# Patient Record
Sex: Female | Born: 1975 | Race: White | Hispanic: No | Smoking: Former smoker
Health system: Southern US, Community
[De-identification: ages and names within clinical notes are randomized; demographics above are authoritative.]

## PROBLEM LIST (undated history)

## (undated) DIAGNOSIS — D649 Anemia, unspecified: Secondary | ICD-10-CM

## (undated) DIAGNOSIS — E079 Disorder of thyroid, unspecified: Secondary | ICD-10-CM

## (undated) HISTORY — PX: TUBAL LIGATION: SHX77

## (undated) HISTORY — PX: LEEP: SHX91

## (undated) HISTORY — DX: Disorder of thyroid, unspecified: E07.9

## (undated) HISTORY — DX: Anemia, unspecified: D64.9

---

## 2000-07-26 ENCOUNTER — Other Ambulatory Visit: Admission: RE | Admit: 2000-07-26 | Discharge: 2000-07-26 | Payer: Self-pay | Admitting: Gynecology

## 2000-07-26 ENCOUNTER — Encounter (INDEPENDENT_AMBULATORY_CARE_PROVIDER_SITE_OTHER): Payer: Self-pay | Admitting: Specialist

## 2000-08-20 ENCOUNTER — Other Ambulatory Visit: Admission: RE | Admit: 2000-08-20 | Discharge: 2000-08-20 | Payer: Self-pay | Admitting: Gynecology

## 2000-08-20 ENCOUNTER — Encounter (INDEPENDENT_AMBULATORY_CARE_PROVIDER_SITE_OTHER): Payer: Self-pay

## 2001-02-01 ENCOUNTER — Other Ambulatory Visit: Admission: RE | Admit: 2001-02-01 | Discharge: 2001-02-01 | Payer: Self-pay | Admitting: Gynecology

## 2002-05-23 ENCOUNTER — Other Ambulatory Visit: Admission: RE | Admit: 2002-05-23 | Discharge: 2002-05-23 | Payer: Self-pay | Admitting: Gynecology

## 2003-06-19 ENCOUNTER — Other Ambulatory Visit: Admission: RE | Admit: 2003-06-19 | Discharge: 2003-06-19 | Payer: Self-pay | Admitting: Gynecology

## 2004-05-27 ENCOUNTER — Other Ambulatory Visit: Admission: RE | Admit: 2004-05-27 | Discharge: 2004-05-27 | Payer: Self-pay | Admitting: Gynecology

## 2005-05-29 ENCOUNTER — Other Ambulatory Visit: Admission: RE | Admit: 2005-05-29 | Discharge: 2005-05-29 | Payer: Self-pay | Admitting: Gynecology

## 2006-06-02 ENCOUNTER — Other Ambulatory Visit: Admission: RE | Admit: 2006-06-02 | Discharge: 2006-06-02 | Payer: Self-pay | Admitting: Gynecology

## 2006-10-26 ENCOUNTER — Ambulatory Visit (HOSPITAL_COMMUNITY): Admission: RE | Admit: 2006-10-26 | Discharge: 2006-10-26 | Payer: Self-pay | Admitting: Obstetrics and Gynecology

## 2007-02-07 ENCOUNTER — Inpatient Hospital Stay (HOSPITAL_COMMUNITY): Admission: AD | Admit: 2007-02-07 | Discharge: 2007-02-09 | Payer: Self-pay | Admitting: Obstetrics and Gynecology

## 2007-03-17 IMAGING — US US ABDOMEN COMPLETE
1 series · 14 of 25 positions shown · non-contrast
Comparison: none

CLINICAL DATA: 25 week pregnant patient with right upper quadrant pain.  
 ABDOMEN ULTRASOUND:
TECHNIQUE: Complete abdominal ultrasound examination was performed including evaluation of the liver, gallbladder, bile ducts, pancreas, kidneys, spleen, IVC, and abdominal aorta.

[Series 1: us abdomen complete · 0.35mm/px · 14 of 70 slices shown]
[im 1/70]
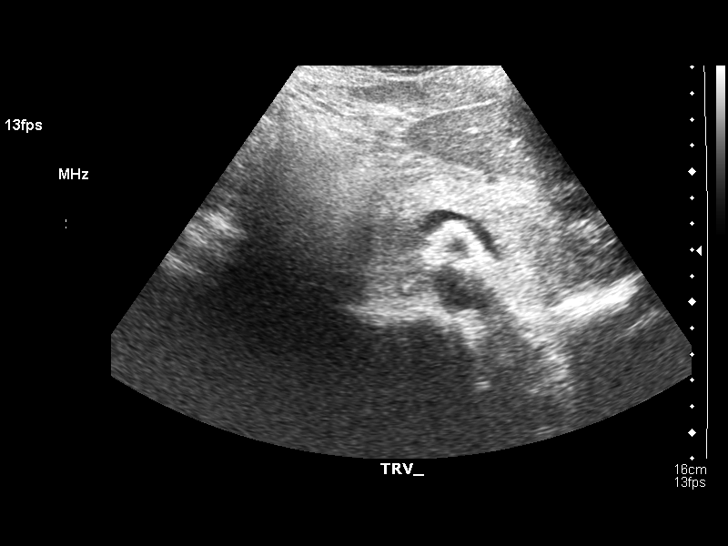
[im 6/70]
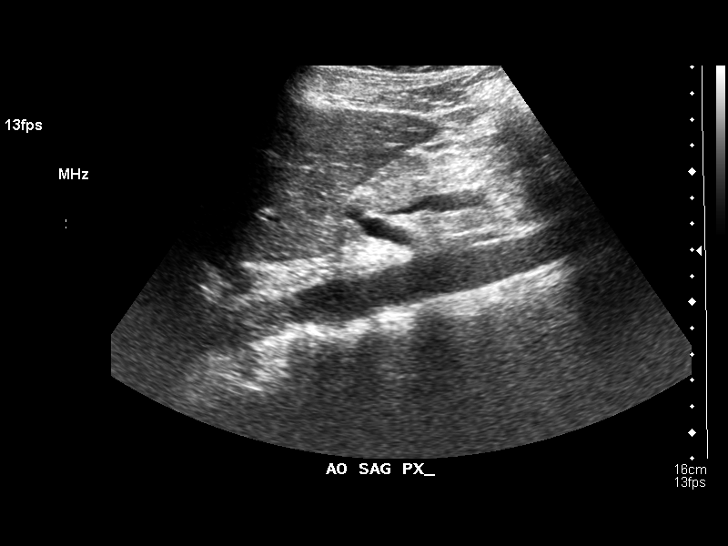
[im 12/70]
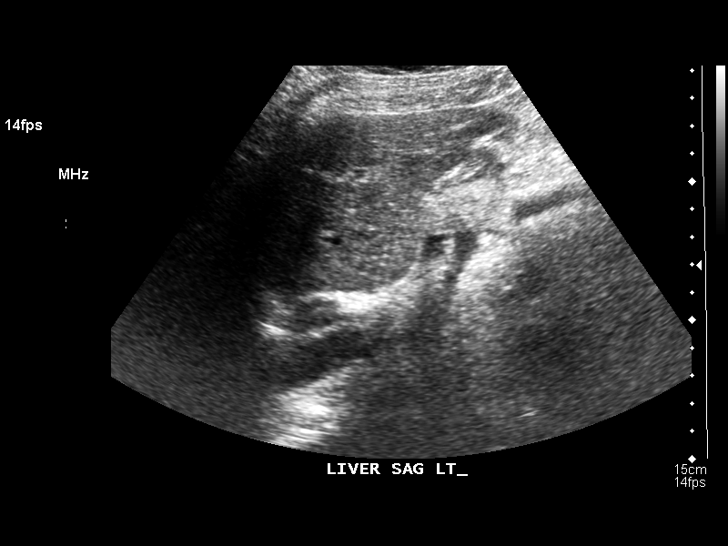
[im 18/70]
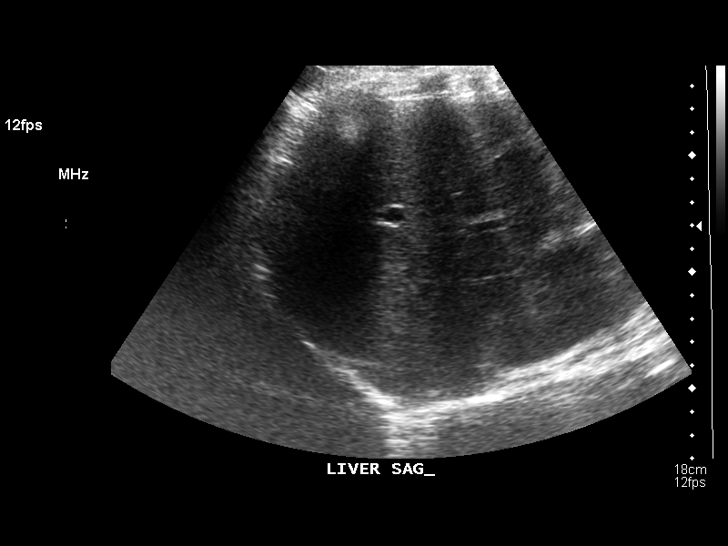
[im 24/70]
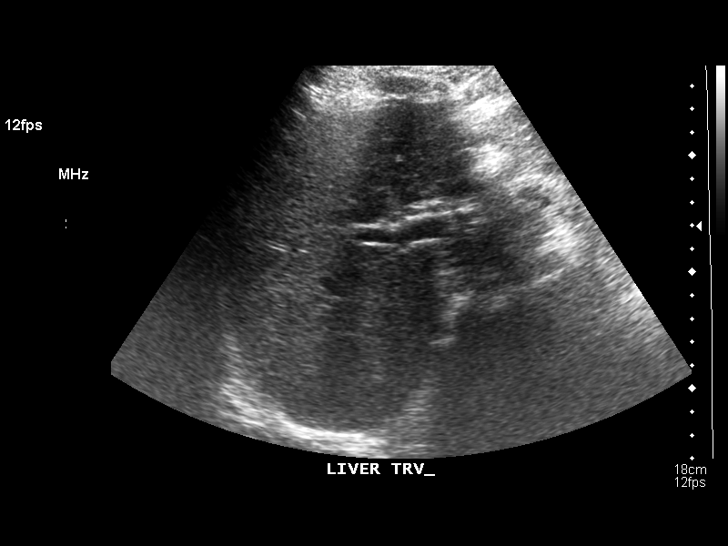
[im 26/70]
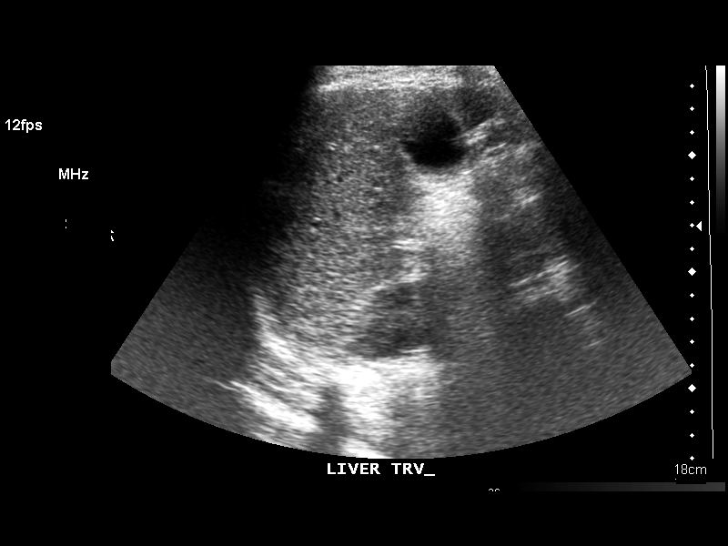
[im 32/70]
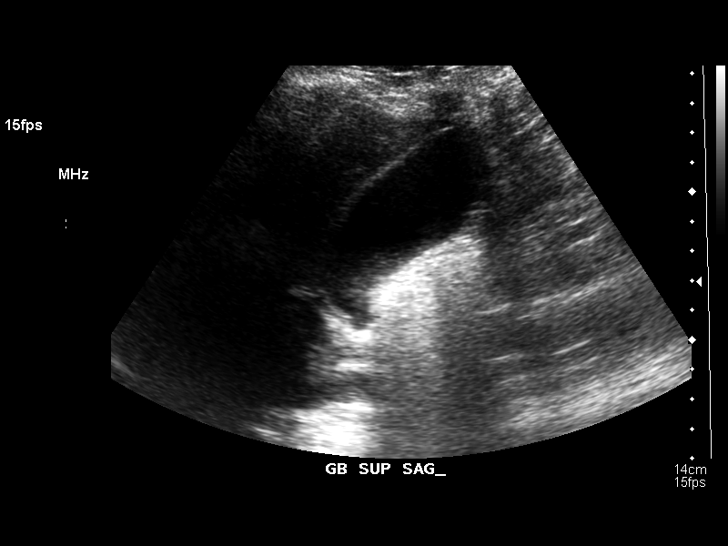
[im 38/70]
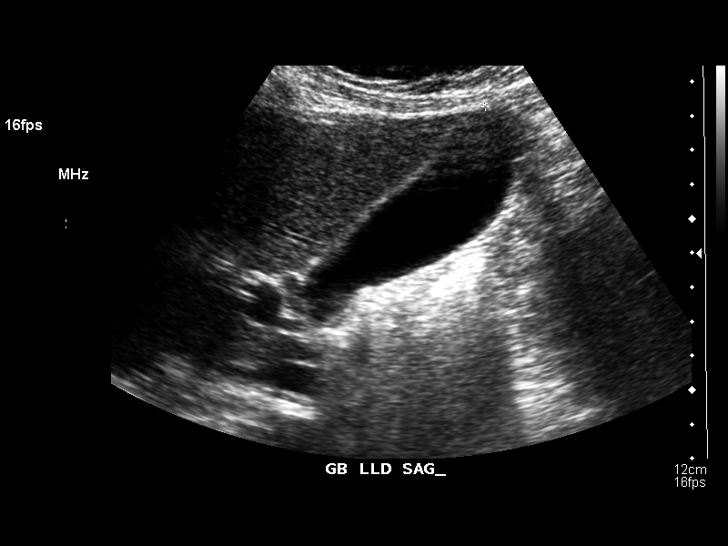
[im 44/70]
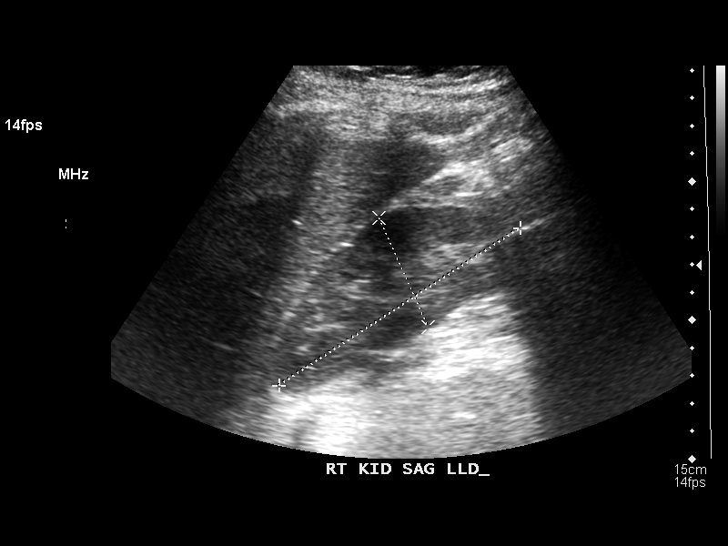
[im 47/70]
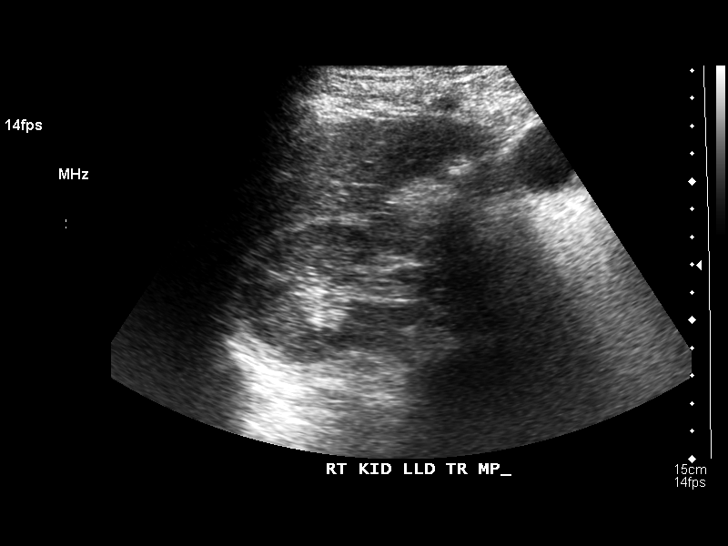
[im 52/70]
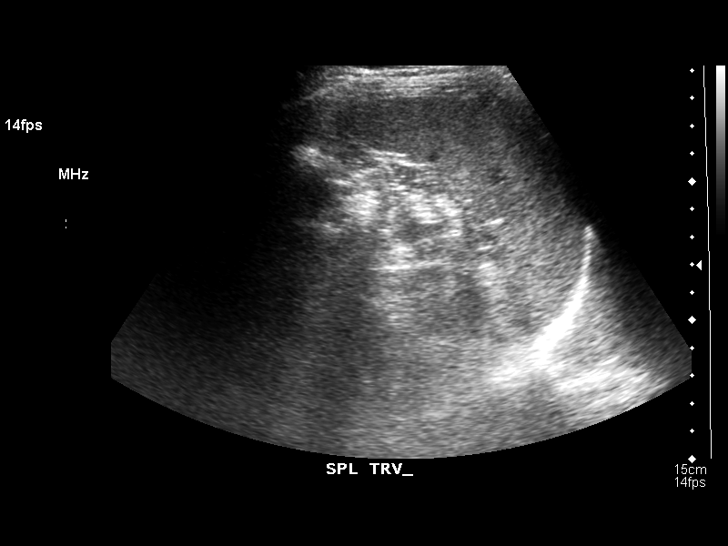
[im 58/70]
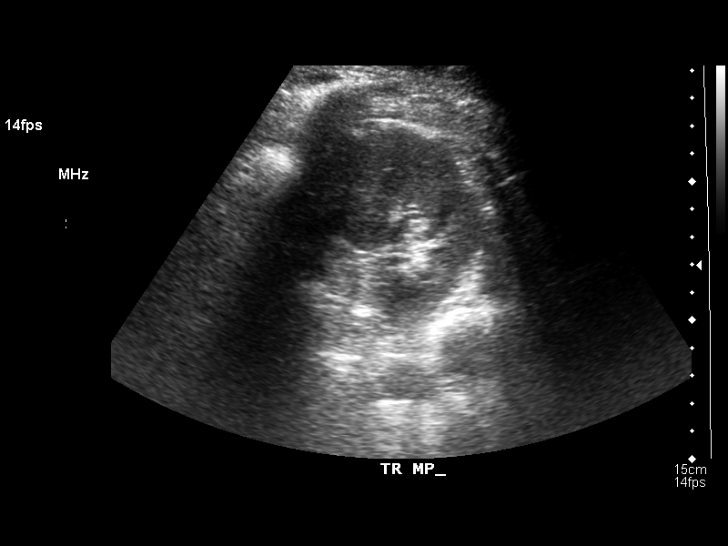
[im 64/70]
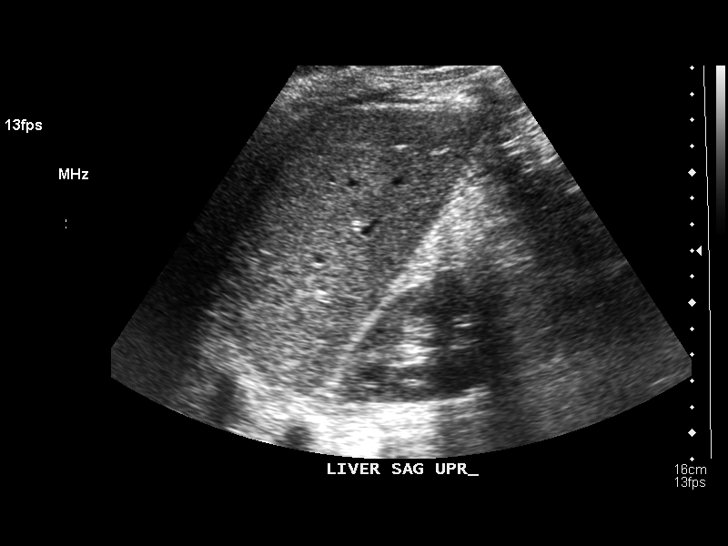
[im 70/70]
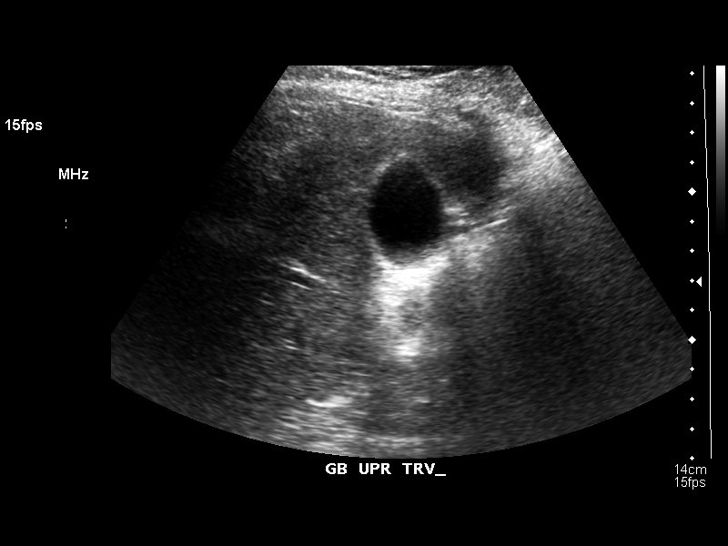

[14 of 25 positions shown; findings below may reference images not displayed]

FINDINGS: The gallbladder has a normal appearance with no evidence of stones, wall thickening, or pericholecystic fluid.  The common duct is normal measuring 2-3 mm in width, and there is no intrahepatic biliary ductal dilatation.  The liver and spleen have a normal size, shape, and echotexture.  The visualized portions of the pancreas, IVC, and abdominal aorta have a normal appearance.  There is no ascites.  Both kidneys have a normal size, shape, and echotexture.   The right kidney measures 10.4 cm and the left kidney measures 11.3 cm in length.  No hydronephrosis or shadowing calculi are seen.
IMPRESSION: Normal abdominal ultrasound.

## 2009-01-29 ENCOUNTER — Inpatient Hospital Stay (HOSPITAL_COMMUNITY): Admission: AD | Admit: 2009-01-29 | Discharge: 2009-01-29 | Payer: Self-pay | Admitting: Obstetrics and Gynecology

## 2009-07-08 ENCOUNTER — Inpatient Hospital Stay (HOSPITAL_COMMUNITY): Admission: AD | Admit: 2009-07-08 | Discharge: 2009-07-10 | Payer: Self-pay | Admitting: Obstetrics and Gynecology

## 2009-07-08 ENCOUNTER — Encounter (INDEPENDENT_AMBULATORY_CARE_PROVIDER_SITE_OTHER): Payer: Self-pay | Admitting: Obstetrics and Gynecology

## 2009-08-28 ENCOUNTER — Ambulatory Visit (HOSPITAL_COMMUNITY): Admission: RE | Admit: 2009-08-28 | Discharge: 2009-08-28 | Payer: Self-pay | Admitting: Obstetrics and Gynecology

## 2011-03-27 LAB — CBC
Hemoglobin: 13 g/dL (ref 12.0–15.0)
MCHC: 34.6 g/dL (ref 30.0–36.0)
Platelets: 220 10*3/uL (ref 150–400)
RBC: 4.11 MIL/uL (ref 3.87–5.11)
RDW: 12.7 % (ref 11.5–15.5)
WBC: 7.3 10*3/uL (ref 4.0–10.5)

## 2011-03-29 LAB — CBC
HCT: 36.4 % (ref 36.0–46.0)
Hemoglobin: 12.6 g/dL (ref 12.0–15.0)
MCHC: 34.5 g/dL (ref 30.0–36.0)
Platelets: 214 10*3/uL (ref 150–400)
RDW: 14.2 % (ref 11.5–15.5)

## 2011-03-29 LAB — RPR: RPR Ser Ql: NONREACTIVE

## 2011-04-07 LAB — CBC
MCHC: 34.3 g/dL (ref 30.0–36.0)
Platelets: 207 10*3/uL (ref 150–400)
RBC: 3.92 MIL/uL (ref 3.87–5.11)
WBC: 18.6 10*3/uL — ABNORMAL HIGH (ref 4.0–10.5)

## 2011-04-07 LAB — URINALYSIS, ROUTINE W REFLEX MICROSCOPIC
Bilirubin Urine: NEGATIVE
Glucose, UA: NEGATIVE mg/dL
Hgb urine dipstick: NEGATIVE
Nitrite: NEGATIVE
Protein, ur: NEGATIVE mg/dL
Specific Gravity, Urine: 1.025 (ref 1.005–1.030)
pH: 6 (ref 5.0–8.0)

## 2011-04-07 LAB — COMPREHENSIVE METABOLIC PANEL
Albumin: 3.2 g/dL — ABNORMAL LOW (ref 3.5–5.2)
CO2: 24 mEq/L (ref 19–32)
Calcium: 8.8 mg/dL (ref 8.4–10.5)
Chloride: 101 mEq/L (ref 96–112)

## 2011-04-07 LAB — DIFFERENTIAL
Basophils Absolute: 0.1 10*3/uL (ref 0.0–0.1)
Eosinophils Absolute: 0.2 10*3/uL (ref 0.0–0.7)
Eosinophils Relative: 1 % (ref 0–5)
Lymphocytes Relative: 6 % — ABNORMAL LOW (ref 12–46)
Lymphs Abs: 1 10*3/uL (ref 0.7–4.0)
Neutrophils Relative %: 91 % — ABNORMAL HIGH (ref 43–77)

## 2011-05-05 NOTE — Discharge Summary (Signed)
Barbara Young, Barbara Young                 ACCOUNT NO.:  [id number]   MEDICAL RECORD NO.:  [id number]          PATIENT TYPE:  INP   LOCATION:  9119                          FACILITY:  [facility]   PHYSICIAN:  Malachi Pro. Ambrose Mantle, M.D. DATE OF BIRTH:  04/30/1976   DATE OF ADMISSION:  07/08/2009  DATE OF DISCHARGE:  07/10/2009                               DISCHARGE SUMMARY   This is a 35 year old white female, para 1-0-0-1, gravida 2, EDC July 05, 2009, admitted with rupture of membranes at 3:30 a.m. on the day of  admission.  Blood group and type O positive, negative antibody,  nonreactive serology, rubella immune, hepatitis B surface antigen  negative, HIV negative, GC and Chlamydia negative, one-hour Glucola 111,  group B strep negative, quad screen negative.  Ultrasound on January 11, 2009, 15 weeks 0 days, [facility] July 05, 2009, urinary tract infection  secondary to E. coli treated with Macrobid on January 22, 2009.  The  patient smoked and was advised to quit.  Prenatal care was otherwise  uncomplicated except the husband has a drug and alcohol abuse history,  but apparently is clean now after rehab.  At approximately 3:30 a.m. on  the day of admission, the patient awoke with spontaneous rupture of  membranes.  She came to the hospital at 5:30 a.m., rupture of membranes  was confirmed, and she was admitted.   PAST MEDICAL HISTORY:  No known allergies.  Operations:  In 2001 a LEEP  procedure, in 2009 breast cyst removed.  Illnesses:  None.  Alcohol,  tobacco, and drugs:  The patient does use tobacco one-half to 1 pack per  day.   FAMILY HISTORY:  No family history in first-degree relatives.   OBSTETRIC HISTORY:  In 2008, the patient delivered an 8-pound 6-ounce  female vaginally.  Her blood pressure dropped to 40 with the epidural.   PHYSICAL EXAMINATION:  On admission, normal vital signs, normal heart  and lungs.  The abdomen was soft.  Fundal height 39 cm.  On July 04, 2009, fetal heart tones  were normal.  Contractions every 3-4 minutes.  Cervix was 3 cm, slightly posterior, 50% vertex at minus 3.  Pitocin was  begun.  The patient entered active labor, felt pressure, and was noted  to be 10 cm dilated afternoon.  Vertex was at a plus 2 station without  pushing.  She delivered spontaneously OA over a second-degree midline  laceration and right periurethral laceration, a living female infant, 6  pounds 1 ounce, Apgars 9 at 1 and 9 at 5 minutes.  Dr. Ambrose Mantle was in  attendance.  There was no time for DeLee suctioning but bulb was used.  Placenta was intact.  Uterus was normal.  Second-degree midline  laceration repaired with 3-0 Vicryl.  Right periurethral laceration  hemostatic and not repaired.  Blood loss about 400 mL.  Postpartum, the  patient did well and was discharged on the second postpartum day.  Methods of sterilization were discussed.  The patient's mother had  expressed some concern about the use of the newer method.  I explained  the newer method as well as the time-honored method, explained the rate  of failure of both.  The patient's hemoglobin on admission was 12.6,  hematocrit 36.4, white count 12,200, platelet count 214,000.  Followup  hemoglobin 11.8.  RPR was nonreactive.   FINAL DIAGNOSIS:  Intrauterine pregnancy at 40+ weeks, delivered occiput  anterior.   OPERATION:  Spontaneous delivery OA, repair of second-degree midline  laceration.   FINAL CONDITION:  Improved.   Instructions include our regular discharge instruction booklet.  The  patient is advised to be aware of potential postpartum depression.  Percocet 30 tablets 5/325 one every 6 hours as needed for pain and  Motrin 600 mg 30  tablets 1 every 6 hours as needed for pain.  The patient is advised to  return to the office in 6 weeks for followup examination and to be sure  that her Medicaid is still in effect at the time her sterilization is  done if she desires to do that.      Malachi Pro.  Ambrose Mantle, M.D.  Electronically Signed     TFH/MEDQ  D:  07/10/2009  T:  07/10/2009  Job:  [id number]

## 2011-05-08 NOTE — Discharge Summary (Signed)
NAME:  Barbara Young, Barbara Young                 ACCOUNT NO.:  [id number]   MEDICAL RECORD NO.:  [id number]          PATIENT TYPE:  INP   LOCATION:  9146                          FACILITY:  [facility]   PHYSICIAN:  Sherron Monday, MD        DATE OF BIRTH:  07/03/1976   DATE OF ADMISSION:  02/07/2007  DATE OF DISCHARGE:  02/09/2007                               DISCHARGE SUMMARY   ADMISSION DIAGNOSES:  Intrauterine pregnancy at term, spontaneous  rupture of membranes.   DISCHARGE DIAGNOSES:  Intrauterine pregnancy at term, spontaneous  rupture of membranes, status post spontaneous vaginal delivery.   HISTORY OF PRESENT ILLNESS:  A 35 year old G1, P0 at 3 and 1 with SROM  at 3:30 a.m. for clear fluid, fetal movement, some spotting and  occasional contractions.   PAST MEDICAL HISTORY:  None significant.   PAST SURGICAL HISTORY:  Significant for a LEEP in 2001.   PAST OBSTETRIC/GYNECOLOGIC HISTORY:  G1 is the present pregnancy without  complications. She has a history of an abnormal Pap smear and status  post LEEP, and they have been normal since then. No history of sexually  transmitted disease.   MEDICATIONS:  Include prenatal vitamins.   ALLERGIES:  No known drug allergies.   SOCIAL HISTORY:  She admits to a half pack a day of tobacco use. No  alcohol or drugs. She is married.   PRENATAL LABORATORY DATA:  Hemoglobin 12.5, platelets 269,000, blood  type O positive, antibody screen negative, Pap smear within normal  limits. Gonorrhea negative, chlamydia negative, RPR nonreactive, rubella  immune, hepatitis B surface antigen negative, HIV nonreactive, normal  AFP. Cystic fibrosis screen within normal limits and Glucola of 132.  Ultrasound in the first trimester had a normal first trimester screen  confirmed for date and showed a normal nuchal thickness. Ultrasound on  September 17, 2006 at 20 weeks showed normal anatomy and an anterior  placenta.   When she was admitted, she had benign exam.  Fetal heart tones were in  the 130s to 140s and reactive, contracting every 3 to 4 minutes. Her  cervix was 1 to 2 cm dilated, 70% effaced and -3 station. We were unable  to obtain her group B strep record as they were not able to be found;  therefore, she was treated and prophylaxed with penicillin for group B  strep prophylaxis. Her intrapartum course was relatively uncomplicated.  She was started on Pitocin to augment her labor. Throughout her labor,  she had some variables and occasional late decelerations, but there were  generally relieved with position change, and they were closely  monitored. She progressed to complete, complete and +2 and pushed to  deliver a viable female infant at 88 with Apgars of 8 and 9 and a weight  of 8 pounds 6 pounds. Placenta was delivered intact at 1906. Directly  before she began pushing and with pushing, she had severe variables  which had quick return to baseline. Cord gas was obtained after delivery  and was found to be 7.32. She has bilateral periurethral laceration and  secondary perineal  laceration repaired with 3-0 Vicryl. Estimated blood  loss was approximately 500 mL. Discussed with patient and the father of  the baby circumcision of a female infant including risks, benefits and  alternatives. They wished to proceed. Postpartum course was relatively  uncomplicated. Her hemoglobin decreased from 12.3 to 10.3. She was  discharged to home on postpartum day 2, having remained afebrile  throughout her postpartum course, and with routine discharge  instructions as well as prescriptions for Motrin, Vicodin, and prenatal  vitamins.   DISCHARGE INFORMATION:  She plans to bottle feed. She is rubella immune.  O positive. Hemoglobin was 12.2 to 10.3. She plans to use birth control  pills which will be established at her 6-week checkup.      Sherron Monday, MD  Electronically Signed     JB/MEDQ  D:  02/09/2007  T:  02/09/2007  Job:  [id number]

## 2019-05-25 ENCOUNTER — Other Ambulatory Visit: Payer: Self-pay

## 2019-05-25 ENCOUNTER — Ambulatory Visit (HOSPITAL_COMMUNITY)
Admission: EM | Admit: 2019-05-25 | Discharge: 2019-05-25 | Disposition: A | Discharge status: Still In Hospital | Payer: Self-pay

## 2019-05-25 ENCOUNTER — Ambulatory Visit (HOSPITAL_COMMUNITY)
Admission: EM | Admit: 2019-05-25 | Discharge: 2019-05-25 | Disposition: A | Discharge status: Home / Self Care | Payer: Self-pay

## 2019-05-25 DIAGNOSIS — R21 Rash and other nonspecific skin eruption: Secondary | ICD-10-CM

## 2019-05-25 DIAGNOSIS — L255 Unspecified contact dermatitis due to plants, except food: Secondary | ICD-10-CM
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
## 2019-05-25 NOTE — ED Triage Notes (Signed)
Pt seen during downtime see downtime chart

## 2023-03-23 ENCOUNTER — Emergency Department (HOSPITAL_BASED_OUTPATIENT_CLINIC_OR_DEPARTMENT_OTHER)
Admission: EM | Admit: 2023-03-23 | Discharge: 2023-03-23 | Disposition: A | Discharge status: Home / Self Care | Payer: Medicaid Other | Attending: Emergency Medicine | Admitting: Emergency Medicine

## 2023-03-23 ENCOUNTER — Emergency Department (HOSPITAL_BASED_OUTPATIENT_CLINIC_OR_DEPARTMENT_OTHER): Payer: Medicaid Other

## 2023-03-23 ENCOUNTER — Other Ambulatory Visit: Payer: Self-pay

## 2023-03-23 ENCOUNTER — Encounter (HOSPITAL_BASED_OUTPATIENT_CLINIC_OR_DEPARTMENT_OTHER): Payer: Self-pay | Admitting: Emergency Medicine

## 2023-03-23 DIAGNOSIS — N920 Excessive and frequent menstruation with regular cycle: Secondary | ICD-10-CM | POA: Insufficient documentation

## 2023-03-23 DIAGNOSIS — N939 Abnormal uterine and vaginal bleeding, unspecified: Secondary | ICD-10-CM | POA: Diagnosis present

## 2023-03-23 LAB — CBC
HCT: 35.1 % — ABNORMAL LOW (ref 36.0–46.0)
Hemoglobin: 11.7 g/dL — ABNORMAL LOW (ref 12.0–15.0)
MCH: 29.5 pg (ref 26.0–34.0)
MCHC: 33.3 g/dL (ref 30.0–36.0)
MCV: 88.4 fL (ref 80.0–100.0)
Platelets: 276 10*3/uL (ref 150–400)
RBC: 3.97 MIL/uL (ref 3.87–5.11)
RDW: 14.3 % (ref 11.5–15.5)
WBC: 10.1 10*3/uL (ref 4.0–10.5)
nRBC: 0 % (ref 0.0–0.2)

## 2023-03-23 LAB — PREGNANCY, URINE: Preg Test, Ur: NEGATIVE

## 2023-03-23 NOTE — ED Provider Notes (Signed)
[facility] Provider Note   CSN: [id number] Arrival date & time: 03/23/23  1816     History  Chief Complaint  Patient presents with   Vaginal Bleeding    Barbara Young is a 47 y.o. female presenting today with vaginal bleeding.  She reports that 3 weeks ago her menstrual period came on time and she had very light bleeding for a week.  For the following 2 weeks she continued to have heavy bleeding which continues today.  Endorses very light cramping but she is passing large clots.  No nausea or vomiting.  No history of fibroids or cysts.  Does report an abnormal Pap smear in the past.  Sexually active without concerns for STDs.  No known coagulopathy.  Not on blood thinners.  Does not remember the last time she saw OB/GYN but she tried to make an appointment and they cannot see her until May.   Vaginal Bleeding      Home Medications Prior to Admission medications   Not on File      Allergies    Patient has no known allergies.    Review of Systems   Review of Systems  Genitourinary:  Positive for vaginal bleeding.    Physical Exam Updated Vital Signs BP (!) 171/75 (BP Location: Right Arm)   Pulse 86   Temp 98.4 F (36.9 C) (Oral)   Resp 18   Ht 5' 2.5" (1.588 m)   Wt 84.8 kg   LMP 03/02/2023 Comment: continues  SpO2 100%   BMI 33.66 kg/m  Physical Exam Vitals and nursing note reviewed.  Constitutional:      Appearance: Normal appearance.  HENT:     Head: Normocephalic and atraumatic.  Eyes:     General: No scleral icterus.    Conjunctiva/sclera: Conjunctivae normal.  Pulmonary:     Effort: Pulmonary effort is normal. No respiratory distress.  Genitourinary:    Comments: Pelvic exam performed in presence of RN.  No external lesions.  Vaginal vault with small amount of dark red blood in the vaginal vault.  No active bleeding from the cervix.  No tenderness on bimanual Skin:    Findings: No rash.  Neurological:      Mental Status: She is alert.  Psychiatric:        Mood and Affect: Mood normal.     ED Results / Procedures / Treatments   Labs (all labs ordered are listed, but only abnormal results are displayed) Labs Reviewed  CBC - Abnormal; Notable for the following components:      Result Value   Hemoglobin 11.7 (*)    HCT 35.1 (*)    All other components within normal limits  PREGNANCY, URINE    EKG None  Radiology US Pelvis Complete  Result Date: 03/23/2023 CLINICAL DATA:  Vaginal bleeding.  Negative pregnancy test. EXAM: TRANSABDOMINAL ULTRASOUND OF PELVIS DOPPLER ULTRASOUND OF OVARIES TECHNIQUE: Transabdominal ultrasound examination of the pelvis was performed including evaluation of the uterus, ovaries, adnexal regions, and pelvic cul-de-sac. Color and duplex Doppler ultrasound was utilized to evaluate blood flow to the ovaries. COMPARISON:  None Available. FINDINGS: Uterus Measurements: 12.1 x 6.1 x 6.4 cm = volume: 250 mL. No fibroids or other mass visualized. Endometrium Thickness: 19 mm.  No focal abnormality visualized. Right ovary Measurements: 5.3 x 2.4 x 3.4 cm = volume: 22 mL. Normal appearance/no adnexal mass. Left ovary Measurements: 3.5 x 1.9 x 3.4 cm = volume: 12 mL. Normal appearance/no  adnexal mass. Pulsed Doppler evaluation demonstrates normal low-resistance arterial and venous waveforms in both ovaries. Other: None IMPRESSION: Unremarkable pelvic ultrasound. Electronically Signed   By: Anner Crete M.D.   On: 03/23/2023 20:32   [place] Art/Ven Flow Abd Pelv Doppler  Result Date: 03/23/2023 CLINICAL DATA:  Vaginal bleeding.  Negative pregnancy test. EXAM: TRANSABDOMINAL ULTRASOUND OF PELVIS DOPPLER ULTRASOUND OF OVARIES TECHNIQUE: Transabdominal ultrasound examination of the pelvis was performed including evaluation of the uterus, ovaries, adnexal regions, and pelvic cul-de-sac. Color and duplex Doppler ultrasound was utilized to evaluate blood flow to the ovaries. COMPARISON:   None Available. FINDINGS: Uterus Measurements: 12.1 x 6.1 x 6.4 cm = volume: 250 mL. No fibroids or other mass visualized. Endometrium Thickness: 19 mm.  No focal abnormality visualized. Right ovary Measurements: 5.3 x 2.4 x 3.4 cm = volume: 22 mL. Normal appearance/no adnexal mass. Left ovary Measurements: 3.5 x 1.9 x 3.4 cm = volume: 12 mL. Normal appearance/no adnexal mass. Pulsed Doppler evaluation demonstrates normal low-resistance arterial and venous waveforms in both ovaries. Other: None IMPRESSION: Unremarkable pelvic ultrasound. Electronically Signed   By: Anner Crete M.D.   On: 03/23/2023 20:32    Procedures Procedures   Medications Ordered in ED Medications - No data to display  ED Course/ Medical Decision Making/ A&P                             Medical Decision Making Amount and/or Complexity of Data Reviewed Labs: ordered. Radiology: ordered.   47 year old female presenting today with menorrhagia.  Differential includes but is not limited to fibroids, cyst, perimenopause, malignancy, coagulopathy  This is not an exhaustive differential.    Past Medical History / Co-morbidities / Social History: History of abnormal Pap smear/LEEP procedure   Physical Exam: Pertinent physical exam findings include Mild amount of dark blood in the vaginal vault.  No clear active bleeding No abdominal tenderness  Lab Tests: I ordered, and personally interpreted labs.  The pertinent results include: Hemoglobin 11.7, negative pregnancy STD testing offered but declined   Imaging Studies: I ordered and independently visualized and interpreted pelvic ultrasound and I agree with the radiologist that no acute findings     MDM/Disposition: This is a 47 year old female presenting today with menorrhagia.  Has been going on for 2 weeks.  Unable to see OB/GYN until May.  She does report that today since she has been in the department the bleeding has slowed.  Pelvic ultrasound is  unremarkable.  Physical exam also did not show any tears, masses or abnormalities in the vaginal vault.  Normal cervix.  I suspect the patient may have some hormone imbalance/may be perimenopausal.  We discussed Megace however agree that this is likely not necessary at this time.  She will follow-up with OB/GYN as planned.  Hemoglobin stable, no active hemorrhaging on exam and patient was agreeable to discharge home.    Final Clinical Impression(s) / ED Diagnoses Final diagnoses:  Menorrhagia with regular cycle    Rx / DC Orders ED Discharge Orders     None      Results and diagnoses were explained to the patient. Return precautions discussed in full. Patient had no additional questions and expressed complete understanding.   This chart was dictated using voice recognition software.  Despite best efforts to proofread,  errors can occur which can change the documentation meaning.     Rhae Hammock, PA-C 03/23/23 2332    [place],  Alvin Critchley, DO 03/23/23 2334

## 2023-03-23 NOTE — Discharge Instructions (Addendum)
You came to the emergency department with heavy vaginal bleeding.  As we discussed, your ultrasound is normal.  Your blood counts are also stable.  I did not see anything alarming on your physical exam.  Please follow-up with OB/GYN as planned.  You may use Tylenol and ibuprofen for any discomfort.  With any dizziness, lightheadedness, palpitations, shortness of breath or worsening bleeding please return to the emergency department.  I recommend that you go to the [facility] emergency department or the women's unit is located so that they can see you if needed.  It was a pleasure to meet you and we hope you feel better!

## 2023-03-23 NOTE — ED Triage Notes (Signed)
Pt via pov from home with heavy vaginal bleeding x 3 weeks. She reports that her last normal period was very light and that she began heavily bleeding a few weeks later. Pt reports that she is soaking 7-8 pads per day. Pt reports golf ball sized clots in the blood. Pt alert & oriented, nad noted.

## 2023-03-29 ENCOUNTER — Encounter (HOSPITAL_BASED_OUTPATIENT_CLINIC_OR_DEPARTMENT_OTHER): Payer: Self-pay | Admitting: Emergency Medicine

## 2023-03-29 ENCOUNTER — Other Ambulatory Visit: Payer: Self-pay

## 2023-03-29 ENCOUNTER — Emergency Department (HOSPITAL_BASED_OUTPATIENT_CLINIC_OR_DEPARTMENT_OTHER)
Admission: EM | Admit: 2023-03-29 | Discharge: 2023-03-30 | Disposition: A | Discharge status: Home / Self Care | Payer: Medicaid Other | Attending: Emergency Medicine | Admitting: Emergency Medicine

## 2023-03-29 DIAGNOSIS — N939 Abnormal uterine and vaginal bleeding, unspecified: Secondary | ICD-10-CM | POA: Insufficient documentation

## 2023-03-29 LAB — CBC
HCT: 26 % — ABNORMAL LOW (ref 36.0–46.0)
Hemoglobin: 8.5 g/dL — ABNORMAL LOW (ref 12.0–15.0)
MCH: 29.3 pg (ref 26.0–34.0)
MCHC: 32.7 g/dL (ref 30.0–36.0)
MCV: 89.7 fL (ref 80.0–100.0)
Platelets: 248 10*3/uL (ref 150–400)
RBC: 2.9 MIL/uL — ABNORMAL LOW (ref 3.87–5.11)
RDW: 14.4 % (ref 11.5–15.5)
WBC: 11 10*3/uL — ABNORMAL HIGH (ref 4.0–10.5)
nRBC: 0 % (ref 0.0–0.2)

## 2023-03-29 LAB — PREGNANCY, URINE: Preg Test, Ur: NEGATIVE

## 2023-03-29 MED ORDER — MEGESTROL ACETATE 40 MG PO TABS: 40.0000 mg | ORAL_TABLET | Freq: Two times a day (BID) | ORAL | 0 refills | Status: DC

## 2023-03-29 MED ORDER — TRANEXAMIC ACID-NACL 1000-0.7 MG/100ML-% IV SOLN
1000.0000 mg | INTRAVENOUS | Status: AC
  Administered 2023-03-29: 1000 mg via INTRAVENOUS
  Filled 2023-03-29: qty 100

## 2023-03-29 MED ORDER — MEGESTROL ACETATE 40 MG PO TABS: 40.0000 mg | ORAL_TABLET | Freq: Every day | ORAL | Status: DC

## 2023-03-29 MED ORDER — FERROUS SULFATE 325 (65 FE) MG PO TABS: 325.0000 mg | ORAL_TABLET | Freq: Every day | ORAL | 1 refills | Status: AC

## 2023-03-29 MED ORDER — MEGESTROL ACETATE 40 MG PO TABS
40.0000 mg | ORAL_TABLET | Freq: Every day | ORAL | Status: DC
  Administered 2023-03-29: 40 mg via ORAL
  Filled 2023-03-29: qty 1

## 2023-03-29 MED ORDER — ESTROGENS CONJUGATED 25 MG IJ SOLR
25.0000 mg | Freq: Once | INTRAMUSCULAR | Status: AC
  Administered 2023-03-30: 25 mg via INTRAVENOUS
  Filled 2023-03-29: qty 25

## 2023-03-29 NOTE — Discharge Instructions (Signed)
Go to [facility] ED if bleeding persist

## 2023-03-29 NOTE — ED Triage Notes (Signed)
Vaginal bleeding, large clots. Seen for similar on 03/23/2023 Today felt dizzy and lightheaded, near syncopal on toilet. No LOC.

## 2023-03-29 NOTE — ED Provider Notes (Signed)
[place] EMERGENCY DEPARTMENT AT [facility] Provider Note   CSN: [id number] Arrival date & time: 03/29/23  2013     History  Chief Complaint  Patient presents with   Vaginal Bleeding    Barbara Young is a 47 y.o. female.  Patient complains of heavy vaginal bleeding.  Patient reports she started having bleeding 3 weeks ago.  Patient came in here on 4 2 and had an ultrasound and laboratory evaluation.  Patient reports bleeding has increased.  Patient reports that she passed a large clot today.  Patient complains of feeling lightheaded.  Patient denies any fever or chills she is not having any abdominal pain  The history is provided by the patient. No language interpreter was used.  Vaginal Bleeding Quality:  Clots Severity:  Severe Onset quality:  Sudden Duration:  3 weeks Timing:  Constant Progression:  Worsening Chronicity:  New Menstrual history:  Irregular Possible pregnancy: no   Relieved by:  Nothing Worsened by:  Nothing Ineffective treatments:  None tried Associated symptoms: no abdominal pain        Home Medications Prior to Admission medications   Medication Sig Start Date End Date Taking? Authorizing Provider  ferrous sulfate 325 (65 FE) MG tablet Take 1 tablet (325 mg total) by mouth daily with breakfast. 03/29/23  Yes Cheron Schaumann K, PA-C  megestrol (MEGACE) 40 MG tablet Take 1 tablet (40 mg total) by mouth 2 (two) times daily. 03/29/23  Yes Elson Areas, PA-C      Allergies    Patient has no known allergies.    Review of Systems   Review of Systems  Gastrointestinal:  Negative for abdominal pain.  Genitourinary:  Positive for vaginal bleeding.  All other systems reviewed and are negative.   Physical Exam Updated Vital Signs BP (!) 108/56   Pulse 85   Temp 98.4 F (36.9 C) (Oral)   Resp 18   LMP 03/02/2023 Comment: continues  SpO2 98%  Physical Exam Vitals reviewed.  Constitutional:      Appearance: Normal appearance.   Cardiovascular:     Rate and Rhythm: Normal rate.  Pulmonary:     Effort: Pulmonary effort is normal.  Abdominal:     General: Abdomen is flat.  Musculoskeletal:        General: Normal range of motion.  Skin:    General: Skin is warm.  Neurological:     General: No focal deficit present.     Mental Status: She is alert.  Psychiatric:        Mood and Affect: Mood normal.     ED Results / Procedures / Treatments   Labs (all labs ordered are listed, but only abnormal results are displayed) Labs Reviewed  CBC - Abnormal; Notable for the following components:      Result Value   WBC 11.0 (*)    RBC 2.90 (*)    Hemoglobin 8.5 (*)    HCT 26.0 (*)    All other components within normal limits  PREGNANCY, URINE    EKG None  Radiology No results found.  Procedures Procedures    Medications Ordered in ED Medications  conjugated estrogens (PREMARIN) injection 25 mg (has no administration in time range)  tranexamic acid (CYKLOKAPRON) IVPB 1,000 mg (has no administration in time range)  megestrol (MEGACE) tablet 40 mg (has no administration in time range)    ED Course/ Medical Decision Making/ A&P  Medical Decision Making Patient complains of heavy vaginal bleeding  Amount and/or Complexity of Data Reviewed Labs: ordered. Decision-making details documented in ED Course.    Details: Labs ordered reviewed and interpreted patient's hemoglobin is 8.5 today.  This is a decrease from 11.7 on 4 2 Discussion of management or test interpretation with external provider(s): I discussed the patient with Dr. Debroah Loop gynecology on call.  He advised giving patient IV TXA and 25 mg IV Premarin tonight he advised placing the patient on Megace 40 mg twice daily for the next month as well as an iron supplement.  Patient is advised to go to [facility] emergency department if bleeding persist.  Patient is to keep scheduled appointment with Dr. Layne Benton  Risk OTC  drugs. Prescription drug management.           Final Clinical Impression(s) / ED Diagnoses Final diagnoses:  Abnormal vaginal bleeding    Rx / DC Orders ED Discharge Orders          Ordered    megestrol (MEGACE) 40 MG tablet  2 times daily        03/29/23 2318    ferrous sulfate 325 (65 FE) MG tablet  Daily with breakfast        03/29/23 2318           An After Visit Summary was printed and given to the patient.    Osie Cheeks 03/29/23 2339    Ernie Avena, MD 03/30/23 [phone]

## 2023-03-29 NOTE — ED Notes (Signed)
On 4/2 hgb was 11.7 today 8.5  IV placed.  [place] walking

## 2023-03-30 NOTE — ED Notes (Signed)
[facility] called by chg RN re:  Premarin injection

## 2023-04-21 HISTORY — PX: TUBAL LIGATION: SHX77

## 2023-05-03 ENCOUNTER — Encounter: Payer: Medicaid Other | Admitting: Advanced Practice Midwife

## 2023-05-05 DIAGNOSIS — R9389 Abnormal findings on diagnostic imaging of other specified body structures: Secondary | ICD-10-CM | POA: Insufficient documentation

## 2023-05-06 ENCOUNTER — Encounter (HOSPITAL_BASED_OUTPATIENT_CLINIC_OR_DEPARTMENT_OTHER): Payer: Self-pay | Admitting: Obstetrics and Gynecology

## 2023-05-06 NOTE — Progress Notes (Signed)
Spoke w/ via phone for pre-op interview--- [facility] needs dos----  UPT per anesthesia. Surgeon orders pending.             Lab results------ COVID test -----patient states asymptomatic no test needed Arrive at -------0530 NPO after MN NO Solid Food.  Med rec completed Medications to take morning of surgery -----NONE Diabetic medication ----- Patient instructed no nail polish to be worn day of surgery Patient instructed to bring photo id and insurance card day of surgery Patient aware to have Driver (ride ) / caregiver  Martina Sinner  for 24 hours after surgery  Patient Special Instructions ----- Pre-Op special Instructions ----- Patient verbalized understanding of instructions that were given at this phone interview. Patient denies shortness of breath, chest pain, fever, cough at this phone interview.

## 2023-05-15 NOTE — H&P (Signed)
Barbara Young is an 47 y.o. female. ***  Pertinent Gynecological History: Menses: {menses:16152} Bleeding: {uterine bleeding:32112} Contraception: {contraception:5051} DES exposure: {denies/unknown:32108} Blood transfusions: {none:33079} Sexually transmitted diseases: {std risk:32110} Previous GYN Procedures: {previous procedures:3041388}  Last mammogram: {normal/abnormal***:32111} Date: *** Last pap: {normal/abnormal***:32111} Date: *** OB History: G***, P***   Menstrual History: Menarche age: *** No LMP recorded (lmp unknown).    History reviewed. No pertinent past medical history.  Past Surgical History:  Procedure Laterality Date   LEEP     pt stated in her 93's   TUBAL LIGATION      History reviewed. No pertinent family history.  Social History:  reports that she has never smoked. She has never used smokeless tobacco. She reports current alcohol use. She reports that she does not currently use drugs.  Allergies: No Known Allergies  No medications prior to admission.    Review of Systems  Constitutional: Negative.   HENT: Negative.    Respiratory: Negative.    Cardiovascular: Negative.   Gastrointestinal: Negative.   Genitourinary:  Positive for menstrual problem.  Musculoskeletal: Negative.   Skin: Negative.   Neurological: Negative.   Psychiatric/Behavioral: Negative.      Height 5' 2.5" (1.588 m), weight 83.5 kg. Physical Exam Constitutional:      Appearance: Normal appearance.  HENT:     Head: Normocephalic and atraumatic.  Cardiovascular:     Rate and Rhythm: Normal rate and regular rhythm.  Pulmonary:     Effort: Pulmonary effort is normal.     Breath sounds: Normal breath sounds.  Abdominal:     General: Bowel sounds are normal.     Palpations: Abdomen is soft.  Genitourinary:    Rectum: Normal.  Musculoskeletal:        General: Normal range of motion.     Cervical back: Normal range of motion.  Skin:    General: Skin is warm and dry.   Neurological:     General: No focal deficit present.     Mental Status: She is alert and oriented to person, place, and time.  Psychiatric:        Mood and Affect: Mood normal.        Behavior: Behavior normal.     No results found for this or any previous visit (from the past 24 hour(s)).  No results found.  Assessment/Plan: ***  Montee Tallman Bovard-Stuckert 05/15/2023, 8:34 PM

## 2023-05-20 ENCOUNTER — Encounter (HOSPITAL_BASED_OUTPATIENT_CLINIC_OR_DEPARTMENT_OTHER)
Admission: RE | Disposition: A | Discharge status: Home / Self Care | Payer: Self-pay | Source: Home / Self Care | Attending: Obstetrics and Gynecology

## 2023-05-20 ENCOUNTER — Ambulatory Visit (HOSPITAL_BASED_OUTPATIENT_CLINIC_OR_DEPARTMENT_OTHER)
Admission: RE | Admit: 2023-05-20 | Discharge: 2023-05-20 | Disposition: A | Discharge status: Home / Self Care | Payer: Medicaid Other | Attending: Obstetrics and Gynecology | Admitting: Obstetrics and Gynecology

## 2023-05-20 ENCOUNTER — Other Ambulatory Visit: Payer: Self-pay

## 2023-05-20 ENCOUNTER — Encounter (HOSPITAL_BASED_OUTPATIENT_CLINIC_OR_DEPARTMENT_OTHER): Payer: Self-pay | Admitting: Obstetrics and Gynecology

## 2023-05-20 ENCOUNTER — Ambulatory Visit (HOSPITAL_BASED_OUTPATIENT_CLINIC_OR_DEPARTMENT_OTHER): Payer: Medicaid Other | Admitting: Certified Registered Nurse Anesthetist

## 2023-05-20 DIAGNOSIS — Z6833 Body mass index (BMI) 33.0-33.9, adult: Secondary | ICD-10-CM | POA: Diagnosis not present

## 2023-05-20 DIAGNOSIS — N939 Abnormal uterine and vaginal bleeding, unspecified: Secondary | ICD-10-CM | POA: Insufficient documentation

## 2023-05-20 DIAGNOSIS — N840 Polyp of corpus uteri: Secondary | ICD-10-CM

## 2023-05-20 DIAGNOSIS — Z01818 Encounter for other preprocedural examination: Secondary | ICD-10-CM

## 2023-05-20 DIAGNOSIS — E669 Obesity, unspecified: Secondary | ICD-10-CM

## 2023-05-20 DIAGNOSIS — R9389 Abnormal findings on diagnostic imaging of other specified body structures: Secondary | ICD-10-CM

## 2023-05-20 HISTORY — PX: DILATATION & CURETTAGE/HYSTEROSCOPY WITH MYOSURE: SHX6511

## 2023-05-20 LAB — CBC
HCT: 40.8 % (ref 36.0–46.0)
Hemoglobin: 12.8 g/dL (ref 12.0–15.0)
MCH: 27.8 pg (ref 26.0–34.0)
MCHC: 31.4 g/dL (ref 30.0–36.0)
MCV: 88.7 fL (ref 80.0–100.0)
Platelets: 296 10*3/uL (ref 150–400)
RBC: 4.6 MIL/uL (ref 3.87–5.11)
RDW: 15.9 % — ABNORMAL HIGH (ref 11.5–15.5)
WBC: 7 10*3/uL (ref 4.0–10.5)
nRBC: 0 % (ref 0.0–0.2)

## 2023-05-20 LAB — POCT PREGNANCY, URINE: Preg Test, Ur: NEGATIVE

## 2023-05-20 LAB — COMPREHENSIVE METABOLIC PANEL
ALT: 32 U/L (ref 0–44)
AST: 22 U/L (ref 15–41)
Albumin: 4.3 g/dL (ref 3.5–5.0)
Alkaline Phosphatase: 70 U/L (ref 38–126)
Anion gap: 10 (ref 5–15)
BUN: 9 mg/dL (ref 6–20)
CO2: 21 mmol/L — ABNORMAL LOW (ref 22–32)
Calcium: 8.7 mg/dL — ABNORMAL LOW (ref 8.9–10.3)
Chloride: 109 mmol/L (ref 98–111)
Creatinine, Ser: 0.73 mg/dL (ref 0.44–1.00)
GFR, Estimated: >60 mL/min (ref >60)
Glucose, Bld: 123 mg/dL — ABNORMAL HIGH (ref 70–99)
Potassium: 3.5 mmol/L (ref 3.5–5.1)
Sodium: 140 mmol/L (ref 135–145)
Total Bilirubin: 0.4 mg/dL (ref 0.3–1.2)
Total Protein: 7.8 g/dL (ref 6.5–8.1)

## 2023-05-20 SURGERY — DILATATION & CURETTAGE/HYSTEROSCOPY WITH MYOSURE
Anesthesia: General | Site: Vagina

## 2023-05-20 MED ORDER — SODIUM CHLORIDE 0.9 % IR SOLN
Status: DC | PRN
  Administered 2023-05-20: 3000 mL

## 2023-05-20 MED ORDER — MEPERIDINE HCL 25 MG/ML IJ SOLN: 6.2500 mg | INTRAMUSCULAR | Status: DC | PRN

## 2023-05-20 MED ORDER — ACETAMINOPHEN 500 MG PO TABS
1000.0000 mg | ORAL_TABLET | ORAL | Status: AC
  Administered 2023-05-20: 1000 mg via ORAL

## 2023-05-20 MED ORDER — KETOROLAC TROMETHAMINE 30 MG/ML IJ SOLN
INTRAMUSCULAR | Status: DC | PRN
  Administered 2023-05-20: 30 mg via INTRAVENOUS

## 2023-05-20 MED ORDER — PROMETHAZINE HCL 25 MG/ML IJ SOLN: 6.2500 mg | INTRAMUSCULAR | Status: DC | PRN

## 2023-05-20 MED ORDER — FENTANYL CITRATE (PF) 100 MCG/2ML IJ SOLN
INTRAMUSCULAR | Status: AC
  Filled 2023-05-20: qty 2

## 2023-05-20 MED ORDER — KETOROLAC TROMETHAMINE 30 MG/ML IJ SOLN
INTRAMUSCULAR | Status: AC
  Filled 2023-05-20: qty 1

## 2023-05-20 MED ORDER — PROPOFOL 10 MG/ML IV BOLUS
INTRAVENOUS | Status: DC | PRN
  Administered 2023-05-20: 200 mg via INTRAVENOUS

## 2023-05-20 MED ORDER — PHENYLEPHRINE 80 MCG/ML (10ML) SYRINGE FOR IV PUSH (FOR BLOOD PRESSURE SUPPORT)
PREFILLED_SYRINGE | INTRAVENOUS | Status: DC | PRN
  Administered 2023-05-20 (×3): 80 ug via INTRAVENOUS

## 2023-05-20 MED ORDER — LIDOCAINE HCL (PF) 2 % IJ SOLN
INTRAMUSCULAR | Status: AC
  Filled 2023-05-20: qty 5

## 2023-05-20 MED ORDER — MIDAZOLAM HCL 5 MG/5ML IJ SOLN
INTRAMUSCULAR | Status: DC | PRN
  Administered 2023-05-20: 2 mg via INTRAVENOUS

## 2023-05-20 MED ORDER — PHENYLEPHRINE 80 MCG/ML (10ML) SYRINGE FOR IV PUSH (FOR BLOOD PRESSURE SUPPORT)
PREFILLED_SYRINGE | INTRAVENOUS | Status: AC
  Filled 2023-05-20: qty 10

## 2023-05-20 MED ORDER — OXYCODONE HCL 5 MG PO TABS: 5.0000 mg | ORAL_TABLET | Freq: Four times a day (QID) | ORAL | 0 refills | Status: AC | PRN

## 2023-05-20 MED ORDER — OXYCODONE HCL 5 MG/5ML PO SOLN: 5.0000 mg | Freq: Once | ORAL | Status: DC | PRN

## 2023-05-20 MED ORDER — PROPOFOL 10 MG/ML IV BOLUS
INTRAVENOUS | Status: AC
  Filled 2023-05-20: qty 20

## 2023-05-20 MED ORDER — LACTATED RINGERS IV SOLN: INTRAVENOUS | Status: DC

## 2023-05-20 MED ORDER — ONDANSETRON HCL 4 MG/2ML IJ SOLN
INTRAMUSCULAR | Status: DC | PRN
  Administered 2023-05-20: 4 mg via INTRAVENOUS

## 2023-05-20 MED ORDER — ONDANSETRON HCL 4 MG/2ML IJ SOLN
INTRAMUSCULAR | Status: AC
  Filled 2023-05-20: qty 2

## 2023-05-20 MED ORDER — HYDROMORPHONE HCL 1 MG/ML IJ SOLN: 0.2500 mg | INTRAMUSCULAR | Status: DC | PRN

## 2023-05-20 MED ORDER — DEXAMETHASONE SODIUM PHOSPHATE 10 MG/ML IJ SOLN
INTRAMUSCULAR | Status: DC | PRN
  Administered 2023-05-20: 10 mg via INTRAVENOUS

## 2023-05-20 MED ORDER — IBUPROFEN 800 MG PO TABS: 800.0000 mg | ORAL_TABLET | Freq: Three times a day (TID) | ORAL | 1 refills | Status: AC | PRN

## 2023-05-20 MED ORDER — LIDOCAINE 2% (20 MG/ML) 5 ML SYRINGE
INTRAMUSCULAR | Status: DC | PRN
  Administered 2023-05-20: 40 mg via INTRAVENOUS

## 2023-05-20 MED ORDER — LIDOCAINE HCL 1 % IJ SOLN
INTRAMUSCULAR | Status: DC | PRN
  Administered 2023-05-20: 10 mL

## 2023-05-20 MED ORDER — ACETAMINOPHEN 500 MG PO TABS
ORAL_TABLET | ORAL | Status: AC
  Filled 2023-05-20: qty 2

## 2023-05-20 MED ORDER — AMISULPRIDE (ANTIEMETIC) 5 MG/2ML IV SOLN
INTRAVENOUS | Status: AC
  Filled 2023-05-20: qty 2

## 2023-05-20 MED ORDER — MIDAZOLAM HCL 2 MG/2ML IJ SOLN
INTRAMUSCULAR | Status: AC
  Filled 2023-05-20: qty 2

## 2023-05-20 MED ORDER — POVIDONE-IODINE 10 % EX SWAB: 2.0000 | Freq: Once | CUTANEOUS | Status: DC

## 2023-05-20 MED ORDER — AMISULPRIDE (ANTIEMETIC) 5 MG/2ML IV SOLN
INTRAVENOUS | Status: AC
  Filled 2023-05-20: qty 4

## 2023-05-20 MED ORDER — FENTANYL CITRATE (PF) 100 MCG/2ML IJ SOLN
INTRAMUSCULAR | Status: DC | PRN
  Administered 2023-05-20: 50 ug via INTRAVENOUS

## 2023-05-20 MED ORDER — LACTATED RINGERS IV SOLN
INTRAVENOUS | Status: DC
  Administered 2023-05-20: 1000 mL via INTRAVENOUS

## 2023-05-20 MED ORDER — AMISULPRIDE (ANTIEMETIC) 5 MG/2ML IV SOLN
10.0000 mg | Freq: Once | INTRAVENOUS | Status: AC | PRN
  Administered 2023-05-20: 10 mg via INTRAVENOUS

## 2023-05-20 MED ORDER — DEXAMETHASONE SODIUM PHOSPHATE 10 MG/ML IJ SOLN
INTRAMUSCULAR | Status: AC
  Filled 2023-05-20: qty 1

## 2023-05-20 MED ORDER — OXYCODONE HCL 5 MG PO TABS: 5.0000 mg | ORAL_TABLET | Freq: Once | ORAL | Status: DC | PRN

## 2023-05-20 SURGICAL SUPPLY — 16 items
CATH ROBINSON RED A/P 16FR (CATHETERS) ×2 IMPLANT
DEVICE MYOSURE LITE (MISCELLANEOUS) IMPLANT
DEVICE MYOSURE REACH (MISCELLANEOUS) IMPLANT
DILATOR CANAL MILEX (MISCELLANEOUS) IMPLANT
DRSG TELFA 3X8 NADH STRL (GAUZE/BANDAGES/DRESSINGS) ×2 IMPLANT
GAUZE 4X4 16PLY ~~LOC~~+RFID DBL (SPONGE) ×4 IMPLANT
GLOVE BIO SURGEON STRL SZ 6.5 (GLOVE) ×2 IMPLANT
GOWN STRL REUS W/TWL LRG LVL3 (GOWN DISPOSABLE) ×2 IMPLANT
IV NS IRRIG 3000ML ARTHROMATIC (IV SOLUTION) ×2 IMPLANT
KIT PROCEDURE FLUENT (KITS) ×2 IMPLANT
KIT TURNOVER CYSTO (KITS) ×2 IMPLANT
PACK VAGINAL MINOR WOMEN LF (CUSTOM PROCEDURE TRAY) ×2 IMPLANT
PAD OB MATERNITY 4.3X12.25 (PERSONAL CARE ITEMS) ×2 IMPLANT
SEAL CERVICAL OMNI LOK (ABLATOR) IMPLANT
SEAL ROD LENS SCOPE MYOSURE (ABLATOR) ×2 IMPLANT
SLEEVE SCD COMPRESS KNEE MED (STOCKING) ×2 IMPLANT

## 2023-05-20 NOTE — Brief Op Note (Signed)
05/20/2023  8:09 AM  PATIENT:  Barbara Young  47 y.o. female  PRE-OPERATIVE DIAGNOSIS:  endometrium thickened R93.89  POST-OPERATIVE DIAGNOSIS:  endometrium thickened R93.89, multiple uterine polyps  PROCEDURE:  Procedure(s): DILATATION & CURETTAGE/HYSTEROSCOPY WITH POSSIBLE MYOSURE (N/A)  SURGEON:  Surgeon(s) and Role:    * Bovard-Stuckert, Clemens Lachman, MD - Primary  ANESTHESIA:   local and general by LMA  EBL:  10 mL IVF and uop per anesthesia, deficit 260cc   BLOOD ADMINISTERED:none  DRAINS: none   LOCAL MEDICATIONS USED:  LIDOCAINE   SPECIMEN:  Source of Specimen:  endometrial curetting and uterine polyps  DISPOSITION OF SPECIMEN:  PATHOLOGY  COUNTS:  YES  TOURNIQUET:  * No tourniquets in log *  DICTATION: .Other Dictation: Dictation Number [id number]  PLAN OF CARE: Discharge to home after PACU  PATIENT DISPOSITION:  PACU - hemodynamically stable.   Delay start of Pharmacological VTE agent (>24hrs) due to surgical blood loss or risk of bleeding: not applicable

## 2023-05-20 NOTE — Anesthesia Postprocedure Evaluation (Signed)
Anesthesia Post Note  Patient: Barbara Young  Procedure(s) Performed: DILATATION & CURETTAGE/HYSTEROSCOPY WITH POSSIBLE MYOSURE (Vagina )     Patient location during evaluation: PACU Anesthesia Type: General Level of consciousness: awake and alert Pain management: pain level controlled Vital Signs Assessment: post-procedure vital signs reviewed and stable Respiratory status: spontaneous breathing, nonlabored ventilation and respiratory function stable Cardiovascular status: blood pressure returned to baseline and stable Postop Assessment: no apparent nausea or vomiting Anesthetic complications: no   No notable events documented.  Last Vitals:  Vitals:   05/20/23 0830 05/20/23 0845  BP: 126/65 107/66  Pulse: 71 65  Resp: 14 16  Temp:  36.9 C  SpO2: 100% 100%    Last Pain:  Vitals:   05/20/23 0845  TempSrc:   PainSc: 0-No pain                 Lowella Curb

## 2023-05-20 NOTE — Interval H&P Note (Signed)
History and Physical Interval Note:  05/20/2023 7:05 AM  Barbara Young  has presented today for surgery, with the diagnosis of endometrium thickened R93.89.  The various methods of treatment have been discussed with the patient and family. After consideration of risks, benefits and other options for treatment, the patient has consented to  Procedure(s): DILATATION & CURETTAGE/HYSTEROSCOPY WITH POSSIBLE MYOSURE (N/A) as a surgical intervention.  The patient's history has been reviewed, patient examined, no change in status, stable for surgery.  I have reviewed the patient's chart and labs.  Questions were answered to the patient's satisfaction.     [name]

## 2023-05-20 NOTE — Op Note (Signed)
NAME: Barbara Young, Barbara Young MEDICAL RECORD NO: [id number] ACCOUNT NO: [id number] DATE OF BIRTH: 1976/04/02 FACILITY: [facility] LOCATION: [facility] PHYSICIAN: Sherian Rein, MD  Operative Report   DATE OF PROCEDURE: 05/20/2023  PREOPERATIVE DIAGNOSIS:  Thickened endometrium with abnormal uterine bleeding.  POSTOPERATIVE DIAGNOSES:  Thickened endometrium with abnormal uterine bleeding, multiple uterine polyps.  PROCEDURE:  Hysteroscopy, D and C, and MyoSure removal of polyps.  SURGEON:  Sherian Rein, MD  ANESTHESIA:  Local and general by LMA.  ESTIMATED BLOOD LOSS:  Approximately 10 mL.  INTRAVENOUS FLUIDS AND URINE OUTPUT:  Per anesthesia.  Deficit at the end of the hysteroscopic procedure 260 mL.  COMPLICATIONS:  None.  PATHOLOGY:  Multiple uterine polyps throughout the uterus, especially on the anterior aspect of the uterus and in the lower uterine segment.  PROCEDURE DETAILS:  After informed consent was reviewed with the patient including risks, benefits and alternatives of surgical procedure, she was transferred to the operating room, placed on the table in supine position.  General anesthesia was induced  and found to be adequate.  She was then placed in the [place] stirrups, prepped and draped in the normal sterile fashion.  Foley catheter was sterilely placed and removed at the end of the procedure.  After an appropriate timeout was performed, using an  open-sided speculum her cervix was easily visualized and injected with 10 mL of 1% lidocaine for a paracervical block.  Cervix was dilated to accommodate the MyoSure hysteroscope.  The scope was placed in the uterus and a uterine survey performed,  revealed thick growth of uterine polyps especially in the anterior aspect of the uterus.  There were, however polyps in every aspect of the uterine cavity.  Ostia were both seen.  Using the MyoSure Reach device, the polyps were removed.  D and C was also  performed yielding  copious tissue.  A second look with the hysteroscope revealed some polyp tissue still on the anterior wall of the uterus.  Again, the MyoSure device was used to remove this tissue.  Second look hysteroscopy revealed a clear cavity.  Ostia were  both visualized.  Instruments were removed from the vagina.  Tenaculum was removed and noted to be hemostatic.  Sponge, lap and needle count was correct x2 per the operating staff.  The patient tolerated the procedure well and was awakened in stable  condition, taken to the PACU.   VAI D: 05/20/2023 8:16:33 am T: 05/20/2023 8:41:00 am  JOB: [id number]/ [id number]

## 2023-05-20 NOTE — Transfer of Care (Signed)
Immediate Anesthesia Transfer of Care Note  Patient: Barbara Young  Procedure(s) Performed: DILATATION & CURETTAGE/HYSTEROSCOPY WITH POSSIBLE MYOSURE (Vagina )  Patient Location: PACU  Anesthesia Type:General  Level of Consciousness: awake, alert , oriented, and patient cooperative  Airway & Oxygen Therapy: Patient Spontanous Breathing  Post-op Assessment: Report given to RN and Post -op Vital signs reviewed and stable  Post vital signs: Reviewed and stable  Last Vitals:  Vitals Value Taken Time  BP 112/62 05/20/23 0807  Temp    Pulse 72 05/20/23 0809  Resp 13 05/20/23 0809  SpO2 99 % 05/20/23 0809  Vitals shown include unvalidated device data.  Last Pain:  Vitals:   05/20/23 0605  TempSrc:   PainSc: 0-No pain      Patients Stated Pain Goal: 5 (05/20/23 1610)  Complications: No notable events documented.

## 2023-05-20 NOTE — Discharge Instructions (Addendum)
No acetaminophen/Tylenol until after 12:10 pm today if needed.  No ibuprofen, Advil, Aleve, Motrin, ketorolac, meloxicam, naproxen, or other NSAIDS until after 2 pm today if needed.         D & C Home care Instructions:   Personal hygiene:  Used sanitary napkins for vaginal drainage not tampons. Shower or tub bathe the day after your procedure. No douching until bleeding stops. Always wipe from front to back after  Elimination.  Activity: Do not drive or operate any equipment today. The effects of the anesthesia are still present and drowsiness may result. Rest today, not necessarily flat bed rest, just take it easy. You may resume your normal activity in one to 2 days.  Sexual activity: No intercourse for one week or as indicated by your physician  Diet: Eat a light diet as desired this evening. You may resume a regular diet tomorrow.  Return to work: One to 2 days.  General Expectations of your surgery: Vaginal bleeding should be no heavier than a normal period. Spotting may continue up to 10 days. Mild cramps may continue for a couple of days. You may have a regular period in 2-6 weeks.  Unexpected observations call your doctor if these occur: persistent or heavy bleeding. Severe abdominal cramping or pain. Elevation of temperature greater than 100F.      DISCHARGE INSTRUCTIONS: HYSTEROSCOPY / ENDOMETRIAL ABLATION The following instructions have been prepared to help you care for yourself upon your return home.  May Remove Scop patch on or before  May take Ibuprofen after  May take stool softner while taking narcotic pain medication to prevent constipation.  Drink plenty of water.  Personal hygiene:  Use sanitary pads for vaginal drainage, not tampons.  Shower the day after your procedure.  NO tub baths, pools or Jacuzzis for 2-3 weeks.  Wipe front to back after using the bathroom.  Activity and limitations:  Do NOT drive or operate any equipment for 24 hours. The  effects of anesthesia are still present and drowsiness may result.  Do NOT rest in bed all day.  Walking is encouraged.  Walk up and down stairs slowly.  You may resume your normal activity in one to two days or as indicated by your physician. Sexual activity: NO intercourse for at least 2 weeks after the procedure, or as indicated by your Doctor.  Diet: Eat a light meal as desired this evening. You may resume your usual diet tomorrow.  Return to Work: You may resume your work activities in one to two days or as indicated by Therapist, sports.  What to expect after your surgery: Expect to have vaginal bleeding/discharge for 2-3 days and spotting for up to 10 days. It is not unusual to have soreness for up to 1-2 weeks. You may have a slight burning sensation when you urinate for the first day. Mild cramps may continue for a couple of days. You may have a regular period in 2-6 weeks.  Call your doctor for any of the following:  Excessive vaginal bleeding or clotting, saturating and changing one pad every hour.  Inability to urinate 6 hours after discharge from hospital.  Pain not relieved by pain medication.  Fever of 100.4 F or greater.  Unusual vaginal discharge or odor.     Post Anesthesia Home Care Instructions  Activity: Get plenty of rest for the remainder of the day. A responsible individual must stay with you for 24 hours following the procedure.  For the next 24 hours,  DO NOT: -Drive a car -Advertising copywriter -Drink alcoholic beverages -Take any medication unless instructed by your physician -Make any legal decisions or sign important papers.  Meals: Start with liquid foods such as gelatin or soup. Progress to regular foods as tolerated. Avoid greasy, spicy, heavy foods. If nausea and/or vomiting occur, drink only clear liquids until the nausea and/or vomiting subsides. Call your physician if vomiting continues.  Special Instructions/Symptoms: Your throat may feel dry or  sore from the anesthesia or the breathing tube placed in your throat during surgery. If this causes discomfort, gargle with warm salt water. The discomfort should disappear within 24 hours.

## 2023-05-20 NOTE — Anesthesia Procedure Notes (Signed)
Procedure Name: LMA Insertion Date/Time: 05/20/2023 7:29 AM  Performed by: Bishop Limbo, CRNAPre-anesthesia Checklist: Patient identified, Emergency Drugs available, Suction available and Patient being monitored Patient Re-evaluated:Patient Re-evaluated prior to induction Oxygen Delivery Method: Circle System Utilized Preoxygenation: Pre-oxygenation with 100% oxygen Induction Type: IV induction Ventilation: Mask ventilation without difficulty LMA: LMA inserted LMA Size: 4.0 Number of attempts: 1 Placement Confirmation: positive ETCO2 Tube secured with: Tape Dental Injury: Teeth and Oropharynx as per pre-operative assessment

## 2023-05-20 NOTE — Anesthesia Preprocedure Evaluation (Signed)
Anesthesia Evaluation  Patient identified by MRN, date of birth, ID band Patient awake    Reviewed: Allergy & Precautions, H&P , NPO status , Patient's Chart, lab work & pertinent test results  Airway Mallampati: II  TM Distance: >3 FB Neck ROM: Full    Dental no notable dental hx.    Pulmonary neg pulmonary ROS, former smoker   Pulmonary exam normal breath sounds clear to auscultation       Cardiovascular negative cardio ROS Normal cardiovascular exam Rhythm:Regular Rate:Normal     Neuro/Psych negative neurological ROS  negative psych ROS   GI/Hepatic negative GI ROS, Neg liver ROS,,,  Endo/Other  negative endocrine ROS    Renal/GU negative Renal ROS  negative genitourinary   Musculoskeletal negative musculoskeletal ROS (+)    Abdominal  (+) + obese  Peds negative pediatric ROS (+)  Hematology negative hematology ROS (+)   Anesthesia Other Findings   Reproductive/Obstetrics negative OB ROS                             Anesthesia Physical Anesthesia Plan  ASA: 2  Anesthesia Plan: General   Post-op Pain Management: Tylenol PO (pre-op)* and Toradol IV (intra-op)*   Induction: Intravenous  PONV Risk Score and Plan: 3 and Ondansetron, Dexamethasone, Midazolam and Treatment may vary due to age or medical condition  Airway Management Planned: LMA  Additional Equipment:   Intra-op Plan:   Post-operative Plan: Extubation in OR  Informed Consent: I have reviewed the patients History and Physical, chart, labs and discussed the procedure including the risks, benefits and alternatives for the proposed anesthesia with the patient or authorized representative who has indicated his/her understanding and acceptance.     Dental advisory given  Plan Discussed with: CRNA  Anesthesia Plan Comments:        Anesthesia Quick Evaluation

## 2023-05-21 LAB — SURGICAL PATHOLOGY

## 2023-05-24 ENCOUNTER — Encounter (HOSPITAL_BASED_OUTPATIENT_CLINIC_OR_DEPARTMENT_OTHER): Payer: Self-pay | Admitting: Obstetrics and Gynecology

## 2023-06-03 ENCOUNTER — Other Ambulatory Visit: Payer: Self-pay

## 2024-06-15 ENCOUNTER — Encounter: Payer: Self-pay | Admitting: Obstetrics and Gynecology

## 2024-07-06 ENCOUNTER — Ambulatory Visit (AMBULATORY_SURGERY_CENTER)

## 2024-07-06 VITALS — Ht 62.0 in | Wt 170.0 lb

## 2024-07-06 DIAGNOSIS — Z1211 Encounter for screening for malignant neoplasm of colon: Secondary | ICD-10-CM

## 2024-07-06 MED ORDER — PEG 3350-KCL-NA BICARB-NACL 420 G PO SOLR: 4000.0000 mL | Freq: Once | ORAL | 0 refills | Status: AC

## 2024-07-06 MED ORDER — ONDANSETRON HCL 4 MG PO TABS: 4.0000 mg | ORAL_TABLET | Freq: Three times a day (TID) | ORAL | 1 refills | Status: AC | PRN

## 2024-07-06 NOTE — Progress Notes (Signed)
 No egg or soy allergy known to patient  No issues known to pt with past sedation with any surgeries or procedures Patient denies ever being told they had issues or difficulty with intubation  No FH of Malignant Hyperthermia Pt is not on diet pills nor GLP-1 medications Pt is not on home 02  Pt is not on blood thinners  Pt states issues with chronic constipation; states she has bm every other day  No A fib or A flutter Have any cardiac testing pending--no Pt instructed to use Single[employer] or GoodRx for a price reduction on prep  Ambulates independently

## 2024-07-14 ENCOUNTER — Encounter: Payer: Self-pay | Admitting: Gastroenterology

## 2024-07-14 ENCOUNTER — Ambulatory Visit: Admitting: Gastroenterology

## 2024-07-14 VITALS — BP 117/45 | HR 65 | Temp 98.0°F | Resp 14 | Ht 62.0 in | Wt 170.0 lb

## 2024-07-14 DIAGNOSIS — Z1211 Encounter for screening for malignant neoplasm of colon: Secondary | ICD-10-CM

## 2024-07-14 DIAGNOSIS — D122 Benign neoplasm of ascending colon: Secondary | ICD-10-CM

## 2024-07-14 DIAGNOSIS — K5730 Diverticulosis of large intestine without perforation or abscess without bleeding: Secondary | ICD-10-CM | POA: Diagnosis not present

## 2024-07-14 DIAGNOSIS — K648 Other hemorrhoids: Secondary | ICD-10-CM

## 2024-07-14 DIAGNOSIS — K635 Polyp of colon: Secondary | ICD-10-CM

## 2024-07-14 MED ORDER — SODIUM CHLORIDE 0.9 % IV SOLN
500.0000 mL | Freq: Once | INTRAVENOUS | Status: DC
Start: 2024-07-14 — End: 2024-07-14

## 2024-07-14 NOTE — Progress Notes (Signed)
 Pt sedate, gd SR's, VSS, report to RN

## 2024-07-14 NOTE — Progress Notes (Signed)
 Called to room to assist during endoscopic procedure.  Patient ID and intended procedure confirmed with present staff. Received instructions for my participation in the procedure from the performing physician.

## 2024-07-14 NOTE — Patient Instructions (Addendum)
-  Handout on polyps, hemorrhoids and diverticulosis provided -Await pathology results  YOU HAD AN ENDOSCOPIC PROCEDURE TODAY AT THE [facility]:   Refer to the procedure report that was given to you for any specific questions about what was found during the examination.  If the procedure report does not answer your questions, please call your gastroenterologist to clarify.  If you requested that your care partner not be given the details of your procedure findings, then the procedure report has been included in a sealed envelope for you to review at your convenience later.  YOU SHOULD EXPECT: Some feelings of bloating in the abdomen. Passage of more gas than usual.  Walking can help get rid of the air that was put into your GI tract during the procedure and reduce the bloating. If you had a lower endoscopy (such as a colonoscopy or flexible sigmoidoscopy) you may notice spotting of blood in your stool or on the toilet paper. If you underwent a bowel prep for your procedure, you may not have a normal bowel movement for a few days.  Please Note:  You might notice some irritation and congestion in your nose or some drainage.  This is from the oxygen used during your procedure.  There is no need for concern and it should clear up in a day or so.  SYMPTOMS TO REPORT IMMEDIATELY:  Following lower endoscopy (colonoscopy or flexible sigmoidoscopy):  Excessive amounts of blood in the stool  Significant tenderness or worsening of abdominal pains  Swelling of the abdomen that is new, acute  Fever of 100F or higher  For urgent or emergent issues, a gastroenterologist can be reached at any hour by calling [phone]. Do not use MyChart messaging for urgent concerns.    DIET:  We do recommend a small meal at first, but then you may proceed to your regular diet.  Drink plenty of fluids but you should avoid alcoholic beverages for 24 hours.  ACTIVITY:  You should plan to take it easy for the  rest of today and you should NOT DRIVE or use heavy machinery until tomorrow (because of the sedation medicines used during the test).    FOLLOW UP: Our staff will call the number listed on your records the next business day following your procedure.  We will call around 7:15- 8:00 am to check on you and address any questions or concerns that you may have regarding the information given to you following your procedure. If we do not reach you, we will leave a message.     If any biopsies were taken you will be contacted by phone or by letter within the next 1-3 weeks.  Please call us  at [phone] if you have not heard about the biopsies in 3 weeks.    SIGNATURES/CONFIDENTIALITY: You and/or your care partner have signed paperwork which will be entered into your electronic medical record.  These signatures attest to the fact that that the information above on your After Visit Summary has been reviewed and is understood.  Full responsibility of the confidentiality of this discharge information lies with you and/or your care-partner.

## 2024-07-14 NOTE — Progress Notes (Signed)
 [facility] History and Physical   Primary Care Physician:  Patient, No Pcp Per   Reason for Procedure:   Colon cancer screening  Plan:    colonoscopy     HPI: Barbara Young is a 48 y.o. female  here for colonoscopy screening - first time exam.   Patient denies any bowel symptoms at this time other than occasional constipation. No family history of colon cancer known. Otherwise feels well without any cardiopulmonary symptoms.   I have discussed risks / benefits of anesthesia and endoscopic procedure with Barbara Young and they wish to proceed with the exams as outlined today.    Past Medical History:  Diagnosis Date   Anemia    Thyroid disease     Past Surgical History:  Procedure Laterality Date   DILATATION & CURETTAGE/HYSTEROSCOPY WITH MYOSURE N/A 05/20/2023   Procedure: DILATATION & CURETTAGE/HYSTEROSCOPY WITH POSSIBLE MYOSURE;  Surgeon: Danielle Rom, MD;  Location: [facility];  Service: Gynecology;  Laterality: N/A;   LEEP     pt stated in her 63's   TUBAL LIGATION  04/2023    Prior to Admission medications   Medication Sig Start Date End Date Taking? Authorizing Provider  levothyroxine (SYNTHROID) 50 MCG tablet Take 50 mcg by mouth daily.   Yes [provider]  norgestimate-ethinyl estradiol (ORTHO-CYCLEN) 0.25-35 MG-MCG tablet Take 1 tablet by mouth daily.   Yes [provider]  ferrous sulfate  325 (65 FE) MG tablet Take 1 tablet (325 mg total) by mouth daily with breakfast. 03/29/23   Sofia, Leslie K, PA-C  ibuprofen  (ADVIL ) 800 MG tablet Take 1 tablet (800 mg total) by mouth every 8 (eight) hours as needed for moderate pain. 05/20/23   Bovard-Stuckert, Rom, MD  ondansetron  (ZOFRAN ) 4 MG tablet Take 1 tablet (4 mg total) by mouth every 8 (eight) hours as needed for nausea or vomiting. 07/06/24   Keyshia Orwick, Elspeth SQUIBB, MD  oxyCODONE  (OXY IR/ROXICODONE ) 5 MG immediate release tablet Take 1 tablet (5 mg total) by mouth  every 6 (six) hours as needed for severe pain. Patient not taking: Reported on 07/06/2024 05/20/23   Bovard-Stuckert, Jody, MD    Current Outpatient Medications  Medication Sig Dispense Refill   levothyroxine (SYNTHROID) 50 MCG tablet Take 50 mcg by mouth daily.     norgestimate-ethinyl estradiol (ORTHO-CYCLEN) 0.25-35 MG-MCG tablet Take 1 tablet by mouth daily.     ferrous sulfate  325 (65 FE) MG tablet Take 1 tablet (325 mg total) by mouth daily with breakfast. 90 tablet 1   ibuprofen  (ADVIL ) 800 MG tablet Take 1 tablet (800 mg total) by mouth every 8 (eight) hours as needed for moderate pain. 45 tablet 1   ondansetron  (ZOFRAN ) 4 MG tablet Take 1 tablet (4 mg total) by mouth every 8 (eight) hours as needed for nausea or vomiting. 30 tablet 1   oxyCODONE  (OXY IR/ROXICODONE ) 5 MG immediate release tablet Take 1 tablet (5 mg total) by mouth every 6 (six) hours as needed for severe pain. (Patient not taking: Reported on 07/06/2024) 5 tablet 0   Current Facility-Administered Medications  Medication Dose Route Frequency Provider Last Rate Last Admin   0.9 %  sodium chloride  infusion  500 mL Intravenous Once Nijel Flink, Elspeth SQUIBB, MD        Allergies as of 07/14/2024   (No Known Allergies)    Family History  Problem Relation Age of Onset   Colon polyps Mother    Colon polyps Father    Colon  cancer Neg Hx    Esophageal cancer Neg Hx    Rectal cancer Neg Hx    Stomach cancer Neg Hx     Social History   Socioeconomic History   Marital status: Divorced    Spouse name: Not on file   Number of children: Not on file   Years of education: Not on file   Highest education level: Not on file  Occupational History   Not on file  Tobacco Use   Smoking status: Former    Current packs/day: 0.00    Average packs/day: 1 pack/day for 15.0 years (15.0 ttl pk-yrs)    Types: Cigarettes    Start date: 04/20/1994    Quit date: 04/20/2009    Years since quitting: 15.2   Smokeless tobacco: Never  Vaping  Use   Vaping status: Never Used  Substance and Sexual Activity   Alcohol use: Yes    Comment: wine on weekends   Drug use: Not Currently   Sexual activity: Not on file  Other Topics Concern   Not on file  Social History Narrative   ** Merged History Encounter **       Social Drivers of Corporate investment banker Strain: Not on file  Food Insecurity: Not on file  Transportation Needs: Not on file  Physical Activity: Not on file  Stress: Not on file  Social Connections: Not on file  Intimate Partner Violence: Not on file    Review of Systems: All other review of systems negative except as mentioned in the HPI.  Physical Exam: Vital signs BP (!) 157/76   Pulse 73   Temp 98 F (36.7 C)   Ht 5' 2 (1.575 m)   Wt 170 lb (77.1 kg)   SpO2 99%   BMI 31.09 kg/m   General:   Alert,  Well-developed, pleasant and cooperative in NAD Lungs:  Clear throughout to auscultation.   Heart:  Regular rate and rhythm Abdomen:  Soft, nontender and nondistended.   Neuro/Psych:  Alert and cooperative. Normal mood and affect. A and O x 3  Marcey Naval, MD [facility]

## 2024-07-14 NOTE — Op Note (Signed)
 [facility] Patient Name: Barbara Young Procedure Date: 07/14/2024 9:10 AM MRN: [id number] Endoscopist: Elspeth P. Leigh , MD, [id number] Age: 48 Referring MD:  Date of Birth: 1976/09/08 Gender: Female Account #: [id number] Procedure:                Colonoscopy Indications:              Screening for colorectal malignant neoplasm, This                            is the patient's first colonoscopy Medicines:                Monitored Anesthesia Care Procedure:                Pre-Anesthesia Assessment:                           - Prior to the procedure, a History and Physical                            was performed, and patient medications and                            allergies were reviewed. The patient's tolerance of                            previous anesthesia was also reviewed. The risks                            and benefits of the procedure and the sedation                            options and risks were discussed with the patient.                            All questions were answered, and informed consent                            was obtained. Prior Anticoagulants: The patient has                            taken no anticoagulant or antiplatelet agents. ASA                            Grade Assessment: II - A patient with mild systemic                            disease. After reviewing the risks and benefits,                            the patient was deemed in satisfactory condition to                            undergo the procedure.  After obtaining informed consent, the colonoscope                            was passed under direct vision. Throughout the                            procedure, the patient's blood pressure, pulse, and                            oxygen saturations were monitored continuously. The                            Olympus Scope SN: [id number] was introduced through                            the anus and  advanced to the the cecum, identified                            by appendiceal orifice and ileocecal valve. The                            colonoscopy was performed without difficulty. The                            patient tolerated the procedure well. The quality                            of the bowel preparation was good. The ileocecal                            valve, appendiceal orifice, and rectum were                            photographed. Scope In: 9:23:15 AM Scope Out: 9:44:40 AM Scope Withdrawal Time: 0 hours 15 minutes 2 seconds  Total Procedure Duration: 0 hours 21 minutes 25 seconds  Findings:                 The perianal and digital rectal examinations were                            normal.                           A 20 to 25 mm polyp was found in the ascending                            colon. The polyp was flat. The polyp was removed                            with a cold snare. Resection and retrieval were                            complete. Photo taken to show location of the polyp  in relation to the IC valve.                           Multiple small-mouthed diverticula were found in                            the transverse colon and left colon.                           Internal hemorrhoids were found during                            retroflexion. The hemorrhoids were small.                           The exam was otherwise without abnormality. Complications:            No immediate complications. Estimated blood loss:                            Minimal. Estimated Blood Loss:     Estimated blood loss was minimal. Impression:               - One 20 to 25 mm polyp in the ascending colon,                            removed with a cold snare. Resected and retrieved.                           - Diverticulosis in the transverse colon and in the                            left colon.                           - Internal hemorrhoids.                            - The examination was otherwise normal. Recommendation:           - Patient has a contact number available for                            emergencies. The signs and symptoms of potential                            delayed complications were discussed with the                            patient. Return to normal activities tomorrow.                            Written discharge instructions were provided to the                            patient.                           -  Resume previous diet.                           - Continue present medications.                           - Await pathology results. Anticipate repeat                            colonoscopy in 3 years. Elspeth P. Cheyne Boulden, MD 07/14/2024 9:50:07 AM This report has been signed electronically.

## 2024-07-14 NOTE — Progress Notes (Signed)
 Pt's states no medical or surgical changes since previsit or office visit.

## 2024-07-17 ENCOUNTER — Telehealth: Payer: Self-pay

## 2024-07-17 NOTE — Telephone Encounter (Signed)
 Left message on answering machine.

## 2024-07-18 ENCOUNTER — Ambulatory Visit: Payer: Self-pay | Admitting: Gastroenterology

## 2024-07-18 LAB — SURGICAL PATHOLOGY

## 2024-08-23 ENCOUNTER — Ambulatory Visit: Payer: Self-pay

## 2024-08-23 NOTE — Telephone Encounter (Signed)
 Copied from CRM #[id number]. Topic: Clinical - Red Word Triage >> Aug 23, 2024  9:54 AM Vena HERO wrote: Red Word that prompted transfer to Nurse Triage: pt called in complaining of sore throat, headache, sob, sneezing and ears stopped up Reason for Disposition . [1] MILD difficulty breathing (e.g., minimal/no SOB at rest, SOB with walking, pulse < 100) AND [2] NEW-onset or WORSE than normal  Answer Assessment - Initial Assessment Questions No new PCP within timeframe. RN advised UC and to call back and set up new pcp appt.        1. RESPIRATORY STATUS: Describe your breathing? (e.g., wheezing, shortness of breath, unable to speak, severe coughing)      SOB 2. ONSET: When did this breathing problem begin?      2 days ago  3. PATTERN Does the difficult breathing come and go, or has it been constant since it started?      Comes and goes 4. SEVERITY: How bad is your breathing? (e.g., mild, moderate, severe)      mild 5. RECURRENT SYMPTOM: Have you had difficulty breathing before? If Yes, ask: When was the last time? and What happened that time?      yes 6. CARDIAC HISTORY: Do you have any history of heart disease? (e.g., heart attack, angina, bypass surgery, angioplasty)      denies 7. LUNG HISTORY: Do you have any history of lung disease?  (e.g., pulmonary embolus, asthma, emphysema)     denies 8. CAUSE: What do you think is causing the breathing problem?      Family function saturday 9. OTHER SYMPTOMS: Do you have any other symptoms? (e.g., chest pain, cough, dizziness, fever, runny nose) Sore throat, sneezing, ear stuffiness  Protocols used: Breathing Difficulty-A-AH
